# Patient Record
Sex: Male | Born: 1990 | Race: White | Hispanic: No | Marital: Single | Smoking: Never smoker
Health system: Southern US, Community
[De-identification: ages and names within clinical notes are randomized; demographics above are authoritative.]

## PROBLEM LIST (undated history)

## (undated) DIAGNOSIS — J189 Pneumonia, unspecified organism: Secondary | ICD-10-CM

## (undated) HISTORY — PX: WISDOM TOOTH EXTRACTION: SHX21

## (undated) HISTORY — PX: OTHER SURGICAL HISTORY: SHX169

---
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
## 2009-03-01 IMAGING — CR ORBITS - COMPLETE 4+ VIEW
1 series · 3 of 3 positions shown · non-contrast
Comparison: None.

REASON FOR EXAM: swollen s/p trauma
COMMENTS:

PROCEDURE:     MDR - MDR ORBITS COMPLETE  - July 10, 2008  [DATE]
RESULT:     Orbital screen dated 07/10/2008

[Series 1: view not recorded · 0.17mm/px · 3 of 3 slices shown]
[im 1/3]
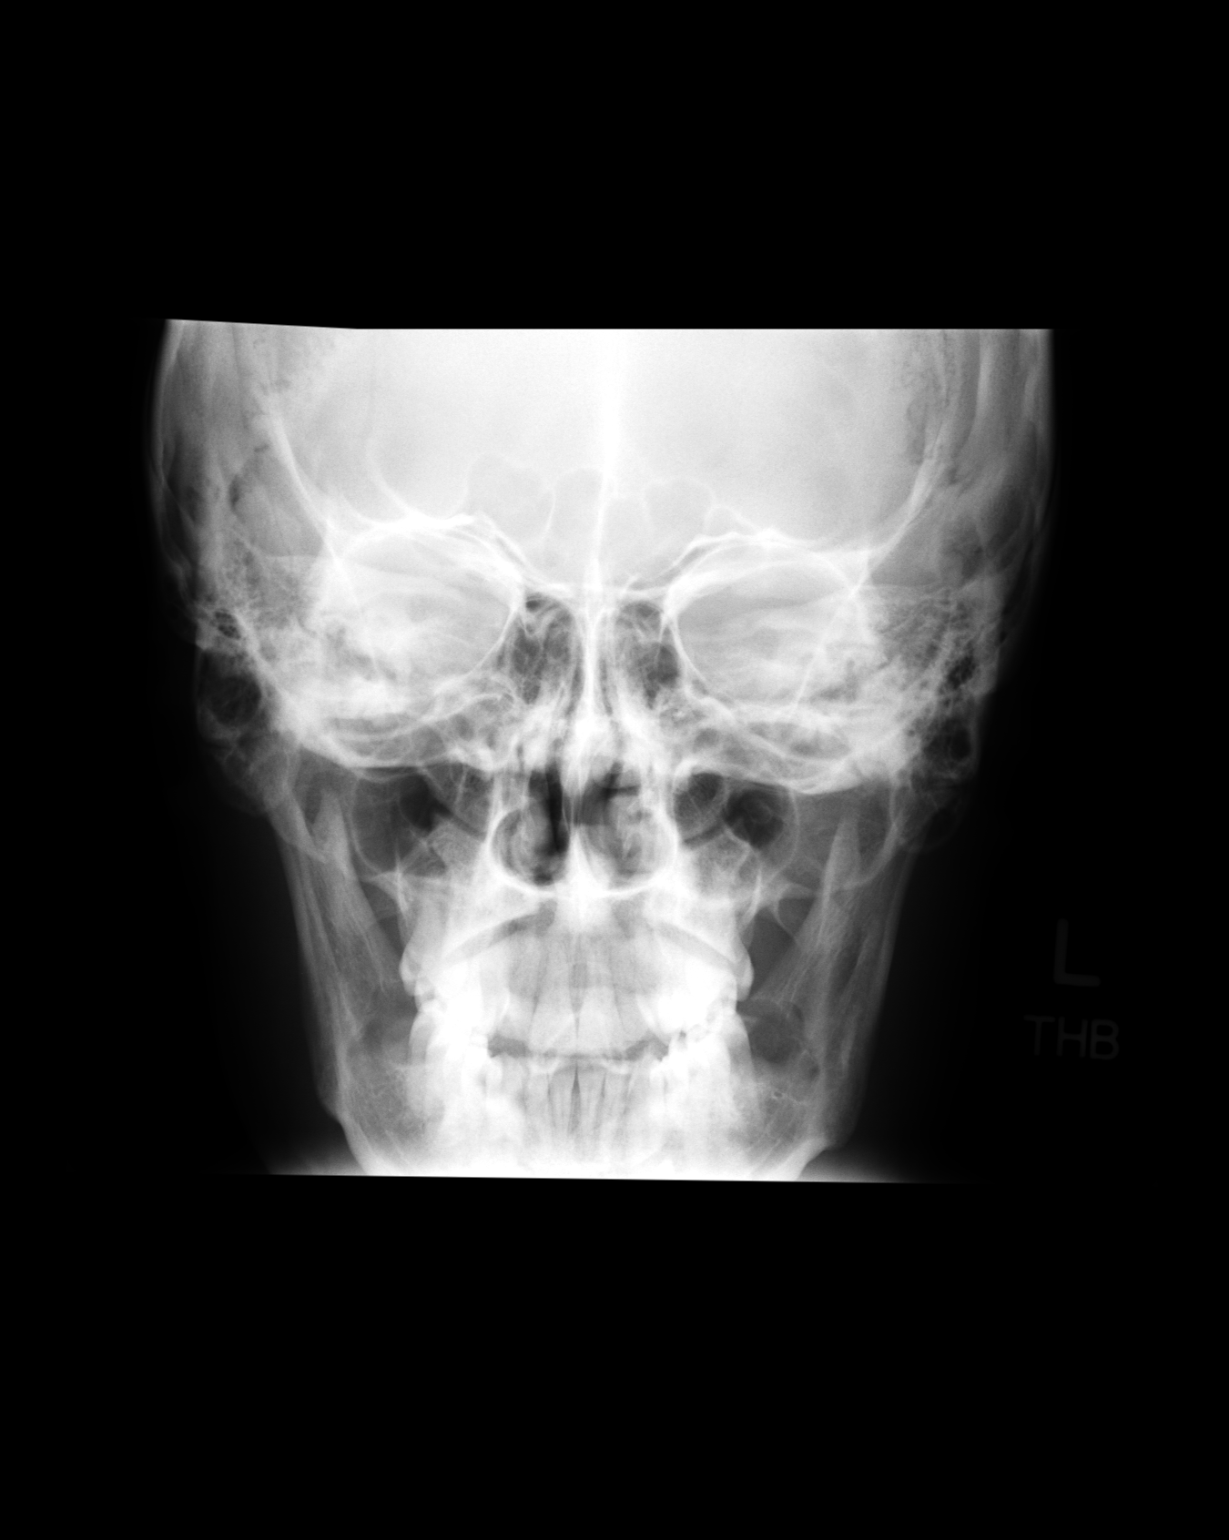
[im 2/3]
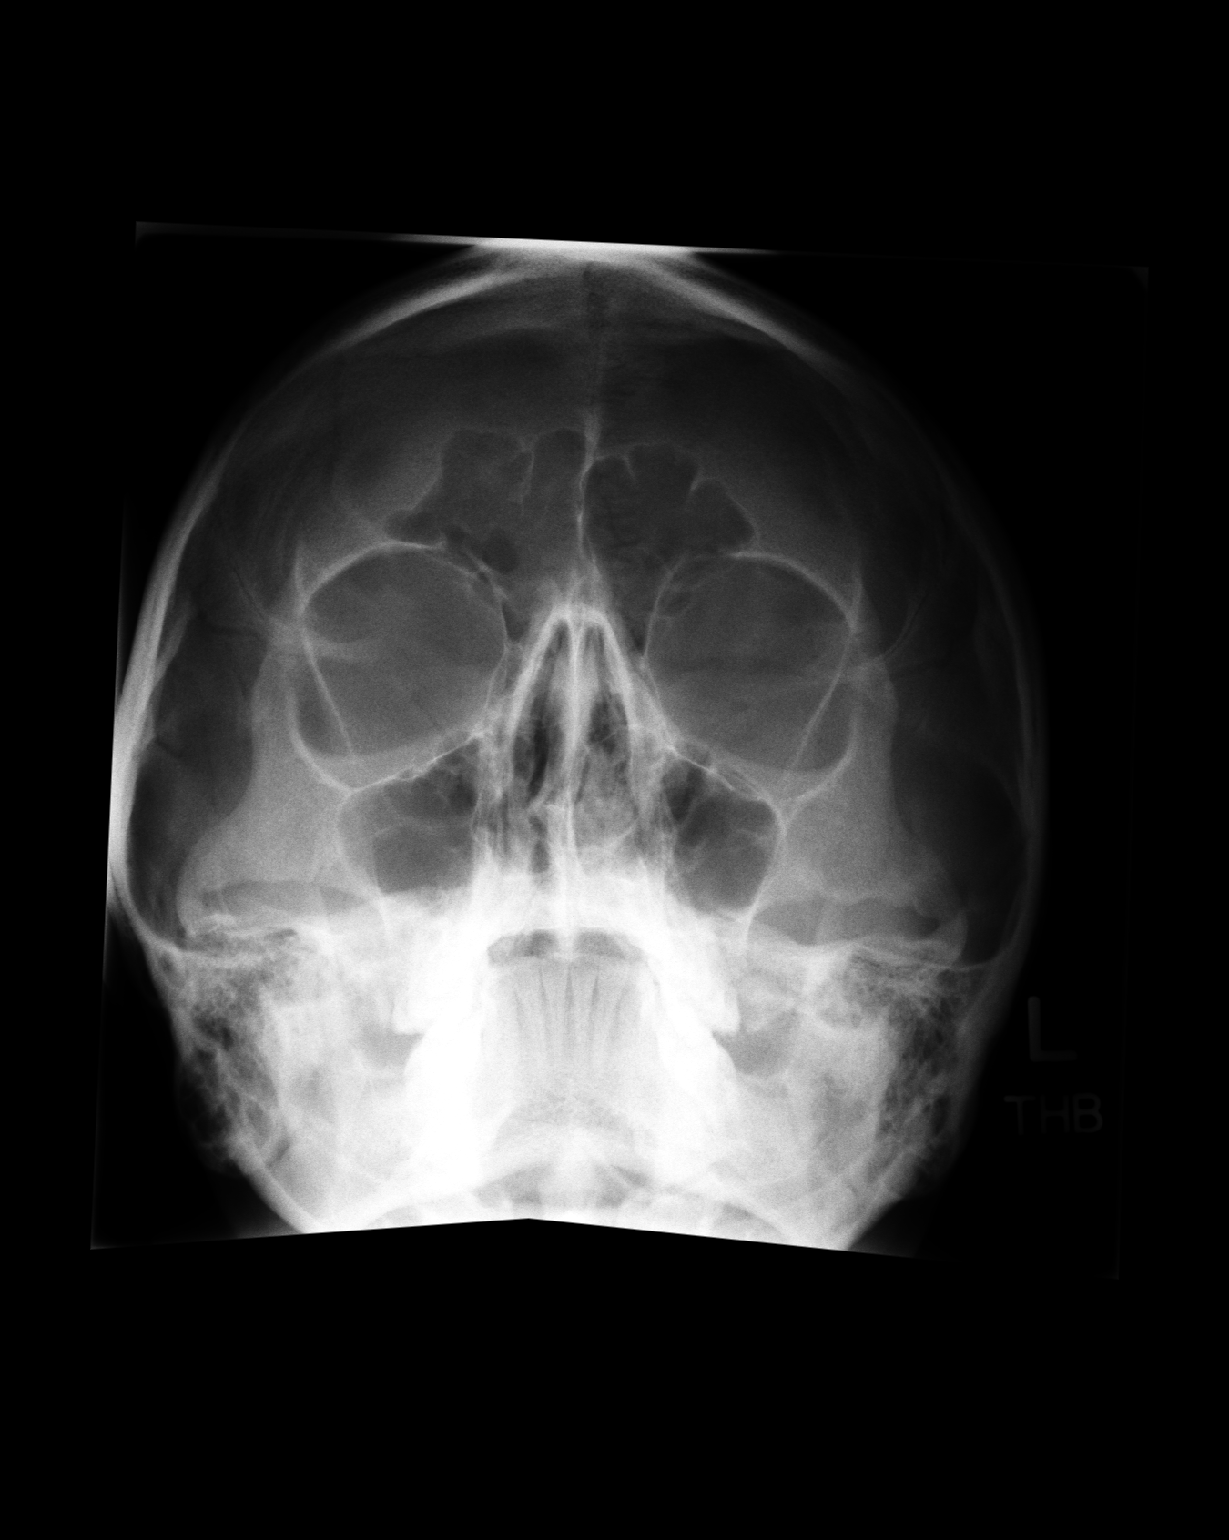
[im 3/3]
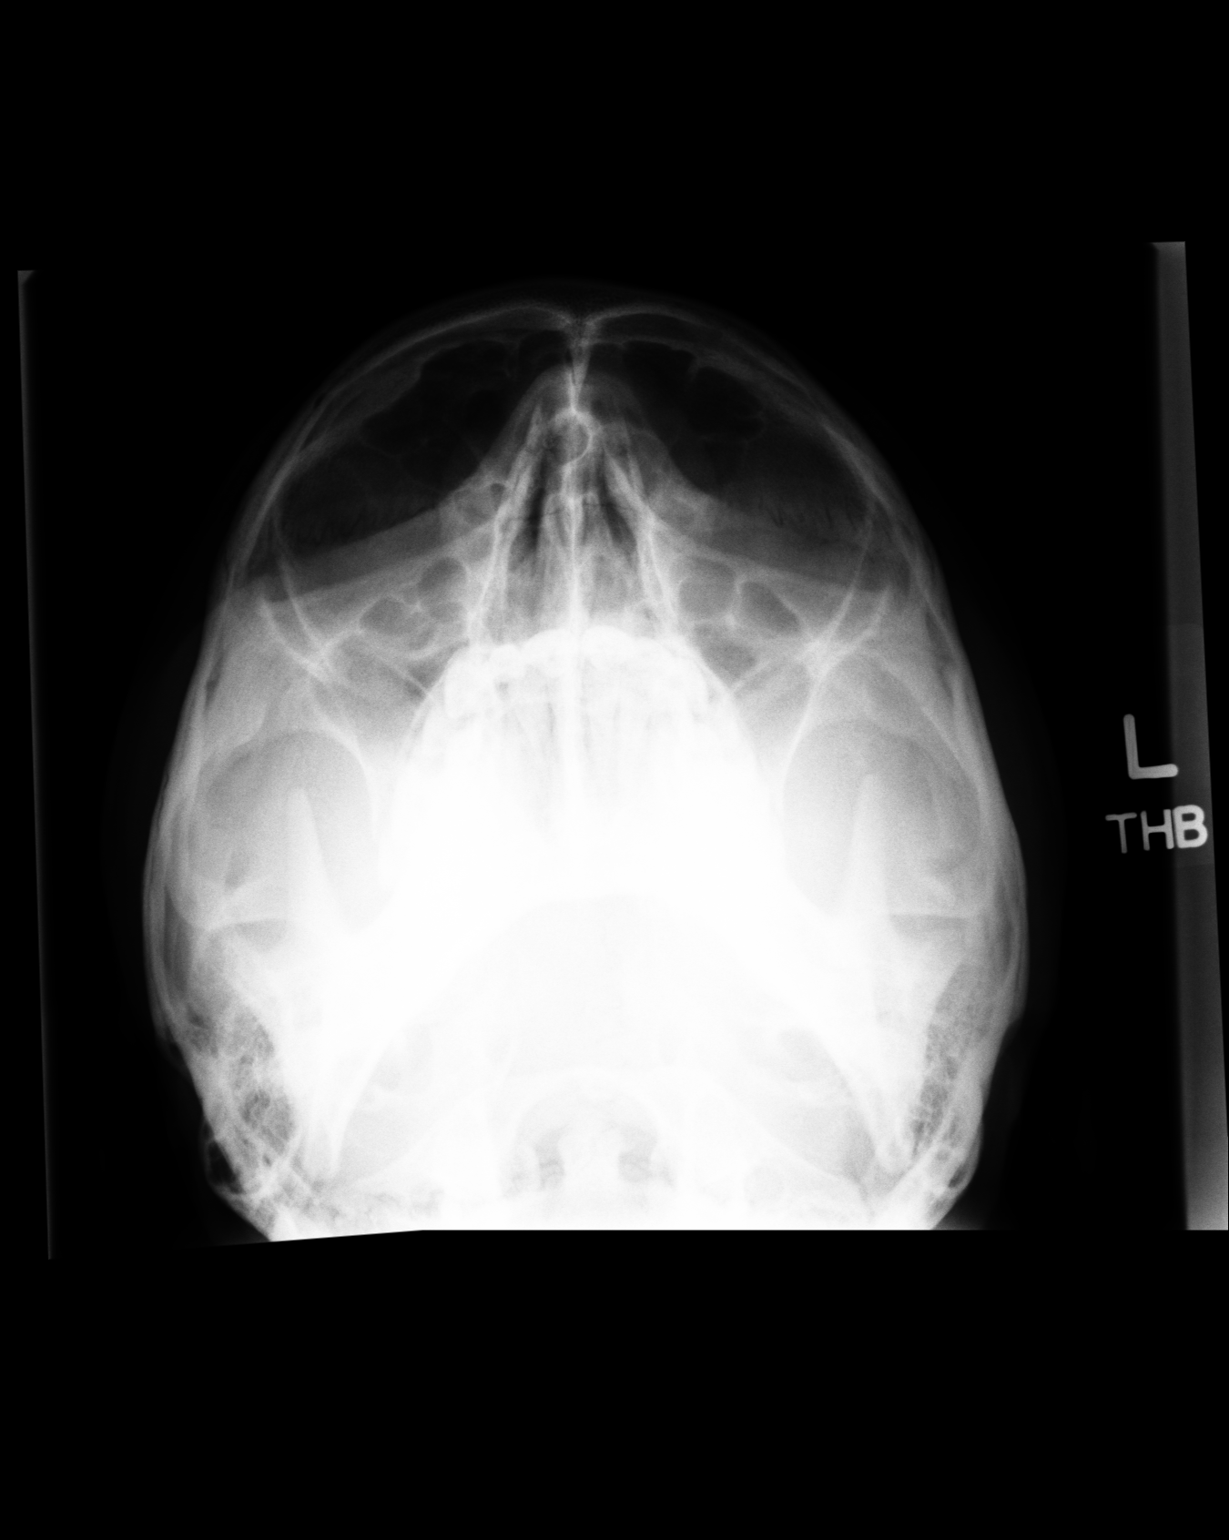

[3 of 3 positions shown; findings below may reference images not displayed]

FINDINGS: No metallic objects identified. No air-fluid levels within the
paranasal sinuses.
IMPRESSION: See above

## 2015-09-19 ENCOUNTER — Ambulatory Visit
Admission: EM | Admit: 2015-09-19 | Discharge: 2015-09-19 | Disposition: A | Discharge status: Home / Self Care | Payer: Self-pay | Attending: Family Medicine | Admitting: Family Medicine

## 2015-09-19 DIAGNOSIS — N433 Hydrocele, unspecified: Secondary | ICD-10-CM

## 2015-09-19 DIAGNOSIS — Z024 Encounter for examination for driving license: Secondary | ICD-10-CM

## 2015-09-19 DIAGNOSIS — Z021 Encounter for pre-employment examination: Secondary | ICD-10-CM

## 2015-09-19 LAB — DEPT OF TRANSP DIPSTICK, URINE (ARMC ONLY)
Glucose, UA: NEGATIVE mg/dL
Hgb urine dipstick: NEGATIVE
Protein, ur: NEGATIVE mg/dL
SPECIFIC GRAVITY, URINE: 1.025 (ref 1.005–1.030)

## 2015-09-19 NOTE — ED Provider Notes (Signed)
CSN: [id number]     Arrival date & time 09/19/15  0945 History   First MD Initiated Contact with Patient 09/19/15 1126     No chief complaint on file.  (Consider location/radiation/quality/duration/timing/severity/associated sxs/prior Treatment) The history is provided by the patient. No language interpreter was used.   Patient's here for DOT examination. He states he is healthy no medical problems but then added another problem at the end of the examination No past medical history on file. No past surgical history on file. No family history on file. Social History  Substance Use Topics  . Smoking status: Not on file  . Smokeless tobacco: Not on file  . Alcohol Use: Not on file    Review of Systems  Genitourinary: Positive for scrotal swelling.    Allergies  Review of patient's allergies indicates not on file.  Home Medications   Prior to Admission medications   Not on File   Meds Ordered and Administered this Visit  Medications - No data to display  There were no vitals taken for this visit. No data found.   Physical Exam  Constitutional: He is oriented to person, place, and time. He appears well-developed.  HENT:  Head: Normocephalic and atraumatic.  Eyes: Pupils are equal, round, and reactive to light.  Neck: Normal range of motion.  Cardiovascular: Normal rate and normal heart sounds.   Pulmonary/Chest: Effort normal.  Abdominal: Soft. Hernia confirmed negative in the right inguinal area and confirmed negative in the left inguinal area.  Genitourinary:    Right testis shows no swelling. Left testis shows swelling.  Suspected hydrocele  Musculoskeletal: Normal range of motion.  Neurological: He is alert and oriented to person, place, and time.  Skin: Skin is dry. No rash noted. No erythema.  Psychiatric: He has a normal mood and affect. His behavior is normal.  Vitals reviewed.   ED Course  Procedures (including critical care time)  Labs Review Labs  Reviewed  DEPT OF TRANSP DIPSTICK, URINE([facility] ONLY)    Imaging Review No results found.   Visual Acuity Review  Right Eye Distance:   Left Eye Distance:   Bilateral Distance:    Right Eye Near:   Left Eye Near:    Bilateral Near:      Results for orders placed or performed during the hospital encounter of 09/19/15  Dept of Transp dipstick, urine  Result Value Ref Range   Protein, ur NEGATIVE NEGATIVE mg/dL   Glucose, UA NEGATIVE NEGATIVE mg/dL   Specific Gravity, Urine 1.025 1.005 - 1.030   Hgb urine dipstick NEGATIVE NEGATIVE     MDM   1. Encounter for commercial driver medical examination (CDME)   2. Hydrocele, left    Patient has what appears be a large hydrocele. It is on the left side and has been there since the age 24 according to him. States that no doxepin never evaluated that he's had physicals but the area has been ignored. I strongly suggested that he contact a urologist to be seen and evaluated sure informed him I cannot say for sure that's why he needs a urologist to find out what the best plan and treatment. Will give him a DOT form for 2 years explained to him that all I can do is evaluate him for DOT. Any outside problems or issues has been addressed by him with his PCP or specialist of his choice.    Hassan Rowan, MD 09/19/15 [phone]

## 2015-09-19 NOTE — Discharge Instructions (Signed)
Strongly recommend follow-up with urology for suspected hydrocele. Unable to say for sure is a hydrocele but will provide information about hydrocele and evaluation of scrotal masses. 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 Hydrocele, Adult A hydrocele is a collection of fluid in the loose pouch of skin that holds the testicles (scrotum). Usually, it affects only one testicle. CAUSES This condition may be caused by:  An injury to the scrotum.  An infection.  A tumor or cancer of the testicle.  Twisting of a testicle.  Decreased blood flow to the scrotum. SYMPTOMS A hydrocele feels like a water-filled balloon. It may also feel heavy. A hydrocele can cause:  Swelling of the scrotum. The swelling may decrease when you lie down.  Swelling of the groin.  Mild discomfort in the scrotum.  Pain. This can develop if the hydrocele was caused by infection or twisting. DIAGNOSIS This condition may be diagnosed with a medical history, physical exam, and imaging tests. You may also have blood and urine tests to check for infection. TREATMENT Treatment may include:  Watching and waiting, particularly if the hydrocele causes no symptoms.  Treatment of the underlying condition. This may include using antibiotic medicine.  Surgery to drain the fluid. Some surgical options include:  Needle aspiration. For this procedure, a needle is used to drain fluid.  Hydrocelectomy. For this procedure, an incision is made in the scrotum to remove the fluid sac. HOME CARE INSTRUCTIONS  Keep all follow-up visits as told by your health care provider. This is important.  Watch the hydrocele for any changes.  Take over-the-counter and prescription medicines only as told by your health care provider.  If you were prescribed an antibiotic medicine, use it as told by your health care provider. Do not stop using the antibiotic even if your condition  improves. SEEK MEDICAL CARE IF:  The swelling in your scrotum or groin gets worse.  The hydrocele becomes red, firm, tender to the touch, or painful.  You notice any changes in the hydrocele.  You have a fever.   This information is not intended to replace advice given to you by your health care provider. Make sure you discuss any questions you have with your health care provider.   Document Released: 05/20/2010 Document Revised: 04/16/2015 Document Reviewed: 11/26/2014 Elsevier Interactive Patient Education 2016 [employer]  Scrotal Masses Scrotal swelling is common in men of all ages. Common types of testicular masses include:   Hydrocele. The most common benign testicular mass in an adult. Hydroceles are generally soft and painless collections of fluid in the scrotal sac. These can rapidly change size as the fluid enters or leaves. Hydroceles can be associated with an underlying cancer of the testicle.  Spermatoceles. Generally soft and painless cyst-like masses in the scrotum that contain fluid, usually above the testicle. They can rapidly change size as the fluid enters or leaves. They are more prominent while standing or exercising. Sometimes, spermatoceles may cause a sensation of heaviness or a dull ache.  Orchitis. Inflammation of the testicle. It is painful and may be associated with a fever or symptoms of a urinary tract infection, including frequent and painful urination. It is common in males who have the mumps.  Varicocele. An enlargement of the veins that drain the testicles. Varicoceles usually occur on the left side of the scrotum. This condition can increase the risk of infertility. Varicocele is sometimes more prominent while standing or exercising.  Sometimes, varicoceles may cause a sensation of heaviness or a dull ache.  Inguinal hernia. A bulge caused by a portion of intestine protruding into the scrotum through a weak area in the abdominal muscles. Hernias may or  may not be painful. They are soft and usually enlarge with coughing or straining.  Torsion of the testis. This can cause a testicular mass that develops quickly and is associated with tenderness or fever, or both. It is caused by a twisting of the testicle within the sac. It also reduces the blood supply and can destroy the testis if not treated quickly with surgery.  Epididymitis. Inflammation of the epididymis (a structure attached above and behind the testicle), usually caused by a urinary tract infection or a sexually transmitted infection. This generally shows up as testicular discomfort and swelling and may include pain during urination. It is frequently associated with a testicle infection.  Testicular appendages. Remnants of tissue on the testis present since birth. A testicular appendage can twist on its blood supply and cause pain. In most cases, this is seen as a blue dot on the scrotum.  Hematocele. A collection of blood between the layers of the sac inside the scrotum. It usually is caused by trauma to the scrotum.  Sebaceous cysts. These can be a swelling in the skin of the scrotum and are usually painless.  Cancer (carcinoma) of the skin of the scrotum. It can cause scrotal swelling, but this is rare.   This information is not intended to replace advice given to you by your health care provider. Make sure you discuss any questions you have with your health care provider.   Document Released: 06/06/2003 Document Revised: 08/02/2013 Document Reviewed: 05/22/2013 Elsevier Interactive Patient Education [employer]

## 2015-09-24 ENCOUNTER — Other Ambulatory Visit: Payer: Self-pay | Admitting: Urology

## 2015-09-24 ENCOUNTER — Ambulatory Visit
Admission: RE | Admit: 2015-09-24 | Discharge: 2015-09-24 | Disposition: A | Discharge status: Home / Self Care | Payer: BLUE CROSS/BLUE SHIELD | Source: Ambulatory Visit | Attending: Urology | Admitting: Urology

## 2015-09-24 ENCOUNTER — Other Ambulatory Visit: Payer: Self-pay

## 2015-09-24 ENCOUNTER — Encounter: Payer: Self-pay | Admitting: *Deleted

## 2015-09-24 DIAGNOSIS — N433 Hydrocele, unspecified: Secondary | ICD-10-CM

## 2015-09-24 NOTE — Patient Instructions (Signed)
  Your procedure is scheduled on: 10-01-15 Report to [facility] 2ND FLOOR To find out your arrival time please call [phone] between 1PM - 3PM on 09-30-15  Remember: Instructions that are not followed completely may result in serious medical risk, up to and including death, or upon the discretion of your surgeon and anesthesiologist your surgery may need to be rescheduled.    _X___ 1. Do not eat food or drink liquids after midnight. No gum chewing or hard candies.     _X___ 2. No Alcohol for 24 hours before or after surgery.   ____ 3. Bring all medications with you on the day of surgery if instructed.    ____ 4. Notify your doctor if there is any change in your medical condition     (cold, fever, infections).     Do not wear jewelry, make-up, hairpins, clips or nail polish.  Do not wear lotions, powders, or perfumes. You may wear deodorant.  Do not shave 48 hours prior to surgery. Men may shave face and neck.  Do not bring valuables to the hospital.    [facility] is not responsible for any belongings or valuables.               Contacts, dentures or bridgework may not be worn into surgery.  Leave your suitcase in the car. After surgery it may be brought to your room.  For patients admitted to the hospital, discharge time is determined by your treatment team.   Patients discharged the day of surgery will not be allowed to drive home.   Please read over the following fact sheets that you were given:     ____ Take these medicines the morning of surgery with A SIP OF WATER:    1. NONE  2.   3.   4.  5.  6.  ____ Fleet Enema (as directed)   ____ Use CHG Soap as directed  ____ Use inhalers on the day of surgery  ____ Stop metformin 2 days prior to surgery    ____ Take 1/2 of usual insulin dose the night before surgery and none on the morning of surgery.   ____ Stop Coumadin/Plavix/aspirin-N/A  ____ Stop Anti-inflammatories-NO NSAIDS- TYLENOL  OK   ____ Stop supplements until after surgery.    ____ Bring C-Pap to the hospital.

## 2015-09-30 ENCOUNTER — Inpatient Hospital Stay: Admission: RE | Admit: 2015-09-30 | Payer: BLUE CROSS/BLUE SHIELD | Source: Ambulatory Visit

## 2015-10-01 ENCOUNTER — Ambulatory Visit: Payer: BLUE CROSS/BLUE SHIELD | Admitting: Anesthesiology

## 2015-10-01 ENCOUNTER — Encounter: Payer: Self-pay | Admitting: *Deleted

## 2015-10-01 ENCOUNTER — Encounter
Admission: RE | Disposition: A | Discharge status: Home / Self Care | Payer: Self-pay | Source: Ambulatory Visit | Attending: Urology

## 2015-10-01 ENCOUNTER — Ambulatory Visit
Admission: RE | Admit: 2015-10-01 | Discharge: 2015-10-01 | Disposition: A | Discharge status: Home / Self Care | Payer: BLUE CROSS/BLUE SHIELD | Source: Ambulatory Visit | Attending: Urology | Admitting: Urology

## 2015-10-01 DIAGNOSIS — N432 Other hydrocele: Secondary | ICD-10-CM | POA: Insufficient documentation

## 2015-10-01 DIAGNOSIS — S3022XA Contusion of scrotum and testes, initial encounter: Secondary | ICD-10-CM | POA: Diagnosis present

## 2015-10-01 HISTORY — DX: Pneumonia, unspecified organism: J18.9

## 2015-10-01 HISTORY — PX: HYDROCELE EXCISION: SHX482

## 2015-10-01 SURGERY — HYDROCELECTOMY
Anesthesia: General | Site: Scrotum | Laterality: Left | Wound class: Clean Contaminated

## 2015-10-01 MED ORDER — LIDOCAINE HCL (PF) 1 % IJ SOLN
INTRAMUSCULAR | Status: AC
  Filled 2015-10-01: qty 30

## 2015-10-01 MED ORDER — OXYCODONE-ACETAMINOPHEN 10-325 MG PO TABS: 1.0000 | ORAL_TABLET | ORAL | Status: DC | PRN

## 2015-10-01 MED ORDER — CEFAZOLIN SODIUM 1-5 GM-% IV SOLN
INTRAVENOUS | Status: AC
  Administered 2015-10-01: 1 g via INTRAVENOUS
  Filled 2015-10-01: qty 50

## 2015-10-01 MED ORDER — FAMOTIDINE 20 MG PO TABS
20.0000 mg | ORAL_TABLET | Freq: Once | ORAL | Status: AC
  Administered 2015-10-01: 20 mg via ORAL

## 2015-10-01 MED ORDER — FENTANYL CITRATE (PF) 100 MCG/2ML IJ SOLN
INTRAMUSCULAR | Status: DC | PRN
  Administered 2015-10-01 (×2): 50 ug via INTRAVENOUS
  Administered 2015-10-01 (×4): 25 ug via INTRAVENOUS

## 2015-10-01 MED ORDER — BUPIVACAINE-EPINEPHRINE (PF) 0.5% -1:200000 IJ SOLN
INTRAMUSCULAR | Status: AC
  Filled 2015-10-01: qty 30

## 2015-10-01 MED ORDER — LIDOCAINE HCL (CARDIAC) 20 MG/ML IV SOLN
INTRAVENOUS | Status: DC | PRN
  Administered 2015-10-01: 60 mg via INTRAVENOUS

## 2015-10-01 MED ORDER — CEPHALEXIN 500 MG PO CAPS: 500.0000 mg | ORAL_CAPSULE | Freq: Four times a day (QID) | ORAL | Status: DC

## 2015-10-01 MED ORDER — LACTATED RINGERS IV SOLN
INTRAVENOUS | Status: DC
  Administered 2015-10-01 (×2): via INTRAVENOUS

## 2015-10-01 MED ORDER — DEXAMETHASONE SODIUM PHOSPHATE 10 MG/ML IJ SOLN
INTRAMUSCULAR | Status: DC | PRN
Start: 2015-10-01 — End: 2015-10-01
  Administered 2015-10-01: 10 mg via INTRAVENOUS

## 2015-10-01 MED ORDER — FAMOTIDINE 20 MG PO TABS
ORAL_TABLET | ORAL | Status: AC
  Administered 2015-10-01: 20 mg via ORAL
  Filled 2015-10-01: qty 1

## 2015-10-01 MED ORDER — KETOROLAC TROMETHAMINE 30 MG/ML IJ SOLN
INTRAMUSCULAR | Status: DC | PRN
  Administered 2015-10-01: 30 mg via INTRAVENOUS

## 2015-10-01 MED ORDER — BUPIVACAINE-EPINEPHRINE 0.5% -1:200000 IJ SOLN
INTRAMUSCULAR | Status: DC | PRN
  Administered 2015-10-01: 10 mL

## 2015-10-01 MED ORDER — DOCUSATE SODIUM 100 MG PO CAPS: 200.0000 mg | ORAL_CAPSULE | Freq: Two times a day (BID) | ORAL | Status: DC

## 2015-10-01 MED ORDER — LIDOCAINE HCL 1 % IJ SOLN
INTRAMUSCULAR | Status: DC | PRN
  Administered 2015-10-01: 10 mL

## 2015-10-01 MED ORDER — PROMETHAZINE HCL 25 MG/ML IJ SOLN: 6.2500 mg | INTRAMUSCULAR | Status: DC | PRN

## 2015-10-01 MED ORDER — FENTANYL CITRATE (PF) 100 MCG/2ML IJ SOLN: 25.0000 ug | INTRAMUSCULAR | Status: DC | PRN

## 2015-10-01 MED ORDER — NUCYNTA 50 MG PO TABS: 50.0000 mg | ORAL_TABLET | Freq: Four times a day (QID) | ORAL | Status: DC | PRN

## 2015-10-01 MED ORDER — CEFAZOLIN SODIUM 1-5 GM-% IV SOLN
1.0000 g | Freq: Once | INTRAVENOUS | Status: AC
  Administered 2015-10-01: 1 g via INTRAVENOUS

## 2015-10-01 MED ORDER — ONDANSETRON HCL 4 MG/2ML IJ SOLN
INTRAMUSCULAR | Status: DC | PRN
  Administered 2015-10-01: 4 mg via INTRAVENOUS

## 2015-10-01 MED ORDER — PROPOFOL 10 MG/ML IV BOLUS
INTRAVENOUS | Status: DC | PRN
Start: 2015-10-01 — End: 2015-10-01
  Administered 2015-10-01: 200 mg via INTRAVENOUS
  Administered 2015-10-01: 40 mg via INTRAVENOUS

## 2015-10-01 MED ORDER — ONDANSETRON 8 MG PO TBDP: 8.0000 mg | ORAL_TABLET | Freq: Four times a day (QID) | ORAL | Status: DC | PRN

## 2015-10-01 MED ORDER — MIDAZOLAM HCL 5 MG/5ML IJ SOLN
INTRAMUSCULAR | Status: DC | PRN
  Administered 2015-10-01: 2 mg via INTRAVENOUS

## 2015-10-01 SURGICAL SUPPLY — 31 items
BLADE CLIPPER SURG (BLADE) ×2 IMPLANT
BLADE SURG 15 STRL LF DISP TIS (BLADE) ×1 IMPLANT
BLADE SURG 15 STRL SS (BLADE) ×1
DRAPE PED LAPAROTOMY (DRAPES) ×2 IMPLANT
DRESSING TELFA 4X3 1S ST N-ADH (GAUZE/BANDAGES/DRESSINGS) ×4 IMPLANT
ELECT CAUTERY NEEDLE TIP 1.0 (MISCELLANEOUS) ×2
ELECTRODE CAUTERY NEDL TIP 1.0 (MISCELLANEOUS) ×1 IMPLANT
GAUZE FLUFF 18X24 1PLY STRL (GAUZE/BANDAGES/DRESSINGS) ×2 IMPLANT
GLOVE BIO SURGEON STRL SZ7.5 (GLOVE) ×2 IMPLANT
GOWN STRL REUS W/ TWL LRG LVL3 (GOWN DISPOSABLE) ×2 IMPLANT
GOWN STRL REUS W/TWL LRG LVL3 (GOWN DISPOSABLE) ×2
GOWN STRL REUS W/TWL XL LVL3 (GOWN DISPOSABLE) ×2 IMPLANT
KIT RM TURNOVER STRD PROC AR (KITS) ×2 IMPLANT
LABEL OR SOLS (LABEL) ×2 IMPLANT
NEEDLE HYPO 25X1 1.5 SAFETY (NEEDLE) ×2 IMPLANT
NS IRRIG 500ML POUR BTL (IV SOLUTION) ×2 IMPLANT
PACK BASIN MINOR ARMC (MISCELLANEOUS) ×2 IMPLANT
PAD GROUND ADULT SPLIT (MISCELLANEOUS) ×2 IMPLANT
PREP PVP WINGED SPONGE (MISCELLANEOUS) ×2 IMPLANT
SOL PREP PVP 2OZ (MISCELLANEOUS) ×2
SOLUTION PREP PVP 2OZ (MISCELLANEOUS) ×1 IMPLANT
SUPPORETR ATHLETIC LG (MISCELLANEOUS) ×1 IMPLANT
SUPPORTER ATHLETIC LG (MISCELLANEOUS) ×2
SUT CHROMIC 3 0 SH 27 (SUTURE) ×10 IMPLANT
SUT PLAIN 3 0 SH 27IN (SUTURE) IMPLANT
SUT PLAIN 4 0 FS 2 27 (SUTURE) IMPLANT
SUT VIC AB 4-0 PS2 18 (SUTURE) IMPLANT
SUT VIC AB 4-0 SH 27 (SUTURE)
SUT VIC AB 4-0 SH 27XANBCTRL (SUTURE) IMPLANT
SYR 50ML LL SCALE MARK (SYRINGE) IMPLANT
SYRINGE 10CC LL (SYRINGE) ×2 IMPLANT

## 2015-10-01 NOTE — Discharge Instructions (Addendum)
Hidrocele en los adultos (Hydrocele, Adult) El hidrocele es la acumulacin de lquido en la bolsa de piel flcida que aloja los testculos (escroto). Generalmente, afecta solo a un testculo. CAUSAS Esta afeccin puede ser causada por lo siguiente:  Una lesin en el escroto.  Una infeccin.  Un tumor o cncer en el testculo.  Torsin de Dance movement psychotherapist.  Disminucin del flujo sanguneo Financial trader. SNTOMAS El hidrocele se siente como un globo lleno de [place]. Tambin puede sentirse pesado. El hidrocele puede causar lo siguiente:  Hinchazn del escroto. Esta puede disminuir al [employer]  Hinchazn de la ingle.  Molestia leve en el escroto.  Dolor. Este puede aparecer si el hidrocele fue causado por una infeccin o una torsin. DIAGNSTICO Esta afeccin se puede diagnosticar mediante la historia clnica, un examen fsico y estudios de diagnstico por imgenes. Tambin se pueden hacer anlisis de [place] y de [place] para determinar si hay infecciones. TRATAMIENTO El tratamiento puede incluir lo siguiente:  Consulting civil engineer, en especial si el hidrocele no causa sntomas.  Tratamiento de la afeccin preexistente. Esto puede incluir la administracin de antibiticos.  Ciruga para drenar el lquido. Algunas opciones quirrgicas incluyen lo siguiente:  Aspiracin con aguja. Para este procedimiento, se [place] una aguja para drenar el lquido.  Hidrocelectoma. Para este procedimiento, se realiza una incisin en el escroto para extirpar el saco de lquido. INSTRUCCIONES PARA EL CUIDADO EN EL HOGAR  Concurra a todas las visitas de control como se lo haya indicado el mdico. Esto es importante.  Vigile el hidrocele para detectar cualquier cambio.  Tome los medicamentos de venta libre y los recetados solamente como se lo haya indicado el mdico.  Si le recetaron un antibitico, tmelo como se lo haya indicado el mdico. No deje de usar el antibitico aunque la afeccin  mejore. SOLICITE ATENCIN MDICA SI:  La hinchazn del escroto o de la ingle empeora.  El hidrocele se enrojece, est duro o sensible al tacto, o Presenter, broadcasting.  Observa cambios en el hidrocele.  Tiene fiebre.   Esta informacin no tiene Theme park manager el consejo del mdico. Asegrese de hacerle al mdico cualquier pregunta que tenga.   Document Released: 09/20/2013 Document Revised: 04/16/2015 Elsevier Interactive Patient Education 2016 [employer]. Hydrocelectomy, Care After Refer to this sheet in the next few weeks. These instructions provide you with information about caring for yourself after your procedure. Your health care provider may also give you more specific instructions. Your treatment has been planned according to current medical practices, but problems sometimes occur. Call your health care provider if you have any problems or questions after your procedure. WHAT TO EXPECT AFTER THE PROCEDURE After your procedure, it is common for the pouch that holds your testicles (scrotum) to be painful, swollen, and bruised. HOME CARE INSTRUCTIONS Bathing  Ask your health care provider when you can shower, take baths, or go swimming.  If you were told to wear an athletic support strap, take it off when you shower or take a bath. Incision Care  Follow instructions from your health care provider about how to take care of your incision. Make sure you:  Wash your hands with soap and water before you change your bandage (dressing). If soap and water are not available, use hand sanitizer.  Change your dressing as told by your health care provider.  Leave stitches (sutures) in place.  Check your incision and scrotum every day for signs of infection. Check for:  More redness, swelling, or pain.  Blood or fluid.  Warmth.  Pus or a bad smell. Managing Pain, Stiffness, and Swelling  If directed, apply ice to the injured area:  Put ice in a plastic bag.  Place a towel  between your skin and the bag.  Leave the ice on for 20 minutes, 2-3 times per day. Driving  Do not drive for 24 hours if you received a sedative.  Do not drive or operate heavy machinery while taking prescription pain medicine.  Ask your health care provider when it is safe to drive. Activity  Do not do any activities that require great strength and energy (are vigorous) for as long as told by your health care provider.  Return to your normal activities as told by your health care provider. Ask your health care provider what activities are safe for you.  Do not lift anything that is heavier than 10 lb (4.5 kg) until your health care provider says that it is safe. General Instructions  Take over-the-counter and prescription medicines only as told by your health care provider.  Keep all follow-up visits as told by your health care provider. This is important.  If you were given an athletic support strap, wear it as told by your health care provider.  If you had a drain put in during the procedure, you will need to return to have it removed. SEEK MEDICAL CARE IF:  Your pain gets worse.  You have more redness, swelling, or pain around your scrotum.  You have blood or fluid coming from your scrotum.  Your incision feels warm to the touch.  You have pus or a bad smell coming from your scrotum.  You have a fever.   This information is not intended to replace advice given to you by your health care provider. Make sure you discuss any questions you have with your health care provider.   Document Released: 08/21/2015 Document Reviewed: 05/29/2015 Elsevier Interactive Patient Education 2016 Elsevier Inc. Hydrocele, Adult A hydrocele is a collection of fluid in the loose pouch of skin that holds the testicles (scrotum). Usually, it affects only one testicle. CAUSES This condition may be caused by:  An injury to the scrotum.  An infection.  A tumor or cancer of the  testicle.  Twisting of a testicle.  Decreased blood flow to the scrotum. SYMPTOMS A hydrocele feels like a water-filled balloon. It may also feel heavy. A hydrocele can cause:  Swelling of the scrotum. The swelling may decrease when you lie down.  Swelling of the groin.  Mild discomfort in the scrotum.  Pain. This can develop if the hydrocele was caused by infection or twisting. DIAGNOSIS This condition may be diagnosed with a medical history, physical exam, and imaging tests. You may also have blood and urine tests to check for infection. TREATMENT Treatment may include:  Watching and waiting, particularly if the hydrocele causes no symptoms.  Treatment of the underlying condition. This may include using antibiotic medicine.  Surgery to drain the fluid. Some surgical options include:  Needle aspiration. For this procedure, a needle is used to drain fluid.  Hydrocelectomy. For this procedure, an incision is made in the scrotum to remove the fluid sac. HOME CARE INSTRUCTIONS  Keep all follow-up visits as told by your health care provider. This is important.  Watch the hydrocele for any changes.  Take over-the-counter and prescription medicines only as told by your health care provider.  If you were prescribed an antibiotic medicine, use it as told by your  health care provider. Do not stop using the antibiotic even if your condition improves. SEEK MEDICAL CARE IF:  The swelling in your scrotum or groin gets worse.  The hydrocele becomes red, firm, tender to the touch, or painful.  You notice any changes in the hydrocele.  You have a fever.   This information is not intended to replace advice given to you by your health care provider. Make sure you discuss any questions you have with your health care provider.   Document Released: 05/20/2010 Document Revised: 04/16/2015 Document Reviewed: 11/26/2014 Elsevier Interactive Patient Education 2016 Elsevier  Inc. Hydrocelectomy Hydrocelectomy is a procedure to drain a hydrocele, which is a sac where fluid has built up around the testicles. The procedure may be done if a hydrocele is causing painful swelling in the pouch that holds the testicles (scrotum). LET [facility]ARE PROVIDER KNOW ABOUT:  Any allergies you have.  All medicines you are taking, including vitamins, herbs, eye drops, creams, and over-the-counter medicines.  Previous problems you or members of your family have had with the use of anesthetics.  Any blood disorders you have.  Previous surgeries you have had.  Any medical conditions you have. RISKS AND COMPLICATIONS Generally this is a safe procedure. However, problems may occur, including:  Bleeding.  Bleeding into your scrotum (scrotal hematoma).  Damage to your testicle or the tube that carries sperm out of your testicle (vas deferens).  Infection.  Allergic reactions to medicines. BEFORE THE PROCEDURE  Ask your health care provider about:  Changing or stopping your regular medicines. This is especially important if you are taking diabetes medicines or blood thinners.  Taking medicines such as aspirin and ibuprofen. These medicines can thin your blood. Do not take these medicines before your procedure if your health care provider instructs you not to.  Follow instructions from your health care provider about eating or drinking restrictions.  Plan to have someone take you home after the procedure.  Ask your health care provider how your surgical site will be marked or identified.  You may be given antibiotic medicine to help prevent infection. PROCEDURE  To reduce your risk of infection:  Your health care team will wash or sanitize their hands.  Your scrotum and the area around it will be shaved and washed with soap.  An IV tube will be inserted into one of your veins.  You will be given one or more of the following:  A medicine to make you relax  (sedative).  A medicine to make you fall asleep (general anesthetic).  A small cut (incision) will be made through the skin of your scrotum.  Your testicle and the hydrocele will be located, and the hydrocele sac will be opened with an incision.  The fluid will be drained from the sac.  The sac will be closed with absorbable stitches (sutures) to prevent fluid from building up again.  If you have a large hydrocele, a thin rubber drain may be placed to allow fluid to drain after the procedure.  The incision in your scrotum will be closed with absorbable sutures.  A bandage (dressing) will be placed on your scrotum. The procedure may vary among health care providers and hospitals. AFTER THE PROCEDURE  Your blood pressure, heart rate, breathing rate, and blood oxygen level will be monitored often until the medicines you were given have worn off.  You will be given pain medicine as needed.  Do not drive for 24 hours if you received a  sedative.  You may have to wear an athletic support strap to hold the dressing in place and support your scrotum.          Use ice pack for 24 hours. This information is not intended to replace advice given to you by your health care provider. Make sure you discuss any questions you have with your health care provider.   Document Released: 08/21/2015 Document Reviewed: 05/29/2015 Elsevier Interactive Patient Education 2016 Elsevier Inc. General Anesthesia, Adult, Care After Refer to this sheet in the next few weeks. These instructions provide you with information on caring for yourself after your procedure. Your health care provider may also give you more specific instructions. Your treatment has been planned according to current medical practices, but problems sometimes occur. Call your health care provider if you have any problems or questions after your procedure. WHAT TO EXPECT AFTER THE PROCEDURE After the procedure, it is typical to  experience:  Sleepiness.  Nausea and vomiting. HOME CARE INSTRUCTIONS  For the first 24 hours after general anesthesia:  Have a responsible person with you.  Do not drive a car. If you are alone, do not take public transportation.  Do not drink alcohol.  Do not take medicine that has not been prescribed by your health care provider.  Do not sign important papers or make important decisions.  You may resume a normal diet and activities as directed by your health care provider.  Change bandages (dressings) as directed.  If you have questions or problems that seem related to general anesthesia, call the hospital and ask for the anesthetist or anesthesiologist on call. SEEK MEDICAL CARE IF:  You have nausea and vomiting that continue the day after anesthesia.  You develop a rash. SEEK IMMEDIATE MEDICAL CARE IF:   You have difficulty breathing.  You have chest pain.  You have any allergic problems.   This information is not intended to replace advice given to you by your health care provider. Make sure you discuss any questions you have with your health care provider.   Document Released: 03/08/2001 Document Revised: 12/21/2014 Document Reviewed: 03/30/2012 Elsevier Interactive Patient Education [employer]

## 2015-10-01 NOTE — Anesthesia Procedure Notes (Signed)
Procedure Name: LMA Insertion Date/Time: 10/01/2015 5:28 PM Performed by: Lily KocherPERALTA, Yaritzel Stange Pre-anesthesia Checklist: Patient identified, Patient being monitored, Timeout performed, Emergency Drugs available and Suction available Patient Re-evaluated:Patient Re-evaluated prior to inductionOxygen Delivery Method: Circle system utilized Preoxygenation: Pre-oxygenation with 100% oxygen Intubation Type: IV induction Ventilation: Mask ventilation without difficulty LMA: LMA inserted LMA Size: 5.0 Tube type: Oral Number of attempts: 1 Placement Confirmation: positive ETCO2 and breath sounds checked- equal and bilateral Tube secured with: Tape Dental Injury: Teeth and Oropharynx as per pre-operative assessment

## 2015-10-01 NOTE — Op Note (Signed)
Preoperative diagnosis: Left scrotal hematoma and/or hydrocele Postoperative diagnosis: Same  Procedure: 1. Left hydrocelectomy and evacuation of scrotal hematoma                        2. Left spermatic cord block Surgeon: Suszanne ConnersMichael R. Evelene CroonWolff MD, FACS Anesthesia: Gen.  Indications:See the history and physical. After informed consent the above procedure(s) were requested     Technique and findings: After adequate general anesthesia been obtained the perineum was prepped and draped in the usual fashion. Patient had a large left scrotal mass. A midline raphae scrotal incision was made and carried down sharply through the dartos fascia. Dartos fascia was then divided with electrocautery. The scrotal mass was exposed and appeared to be contiguous with the testicle. The testicle could not be separated from the mass. At this point the mass was incised with Q artery and 300 cc of brown fluid was evacuated. The mass appeared to be a thick-walled old hematoma that was contiguous to the testicle. The testicle was somewhat atrophic but appeared to be viable. Redundant sac was excised and submitted to pathology. The remaining sac was cleaned and irrigated with sterile saline. The sac was then marsupialized posterior to the testicle with a running locking 3-0 chromic suture. All bleeders were then attended to with electrocautery. The testicle was then pexed into the scrotal compartment with 3 strategically placed sutures of 3-0 chromic. Spermatic cord block was then performed with 10 cc of 1% Xylocaine. The subcutaneous tissue and dartos fascia was then injected with a total of 10 cc of 0.5% Marcaine with epinephrine. The dartos fascia was then reapproximated with running and locking 3-0 chromic. The skin was reapproximated with interrupted 3-0 chromic. Sterile dressing and scrotal supporter were applied. The sponge and needle and instrument counts were noted be correct. The procedure was then terminated and patient  transferred to the recovery room in stable condition.

## 2015-10-01 NOTE — H&P (Signed)
  Date of Initial H&P: 09/23/15  History reviewed, patient examined, no change in status, stable for surgery. 

## 2015-10-01 NOTE — Anesthesia Preprocedure Evaluation (Signed)
Anesthesia Evaluation  Patient identified by MRN, date of birth, ID band Patient awake    Reviewed: Allergy & Precautions, H&P , NPO status , Patient's Chart, lab work & pertinent test results, reviewed documented beta blocker date and time   History of Anesthesia Complications Negative for: history of anesthetic complications  Airway Mallampati: I  TM Distance: >3 FB Neck ROM: full    Dental no notable dental hx. (+) Teeth Intact   Pulmonary neg pulmonary ROS,    Pulmonary exam normal breath sounds clear to auscultation       Cardiovascular Exercise Tolerance: Good negative cardio ROS Normal cardiovascular exam Rhythm:regular Rate:Normal     Neuro/Psych negative neurological ROS  negative psych ROS   GI/Hepatic negative GI ROS, Neg liver ROS,   Endo/Other  negative endocrine ROS  Renal/GU negative Renal ROS  negative genitourinary   Musculoskeletal   Abdominal   Peds  Hematology negative hematology ROS (+)   Anesthesia Other Findings Past Medical History:   Pneumonia                                                      Comment:YEARS AGO   Reproductive/Obstetrics negative OB ROS                             Anesthesia Physical Anesthesia Plan  ASA: I  Anesthesia Plan: General   Post-op Pain Management:    Induction:   Airway Management Planned:   Additional Equipment:   Intra-op Plan:   Post-operative Plan:   Informed Consent: I have reviewed the patients History and Physical, chart, labs and discussed the procedure including the risks, benefits and alternatives for the proposed anesthesia with the patient or authorized representative who has indicated his/her understanding and acceptance.   Dental Advisory Given  Plan Discussed with: Anesthesiologist, CRNA and Surgeon  Anesthesia Plan Comments:         Anesthesia Quick Evaluation

## 2015-10-01 NOTE — Transfer of Care (Signed)
Immediate Anesthesia Transfer of Care Note  Patient: Joshua SneddonJeffrey D Ra Jr.  Procedure(s) Performed: Procedure(s): HYDROCELECTOMY ADULT (Left)  Patient Location: PACU  Anesthesia Type:General  Level of Consciousness: awake  Airway & Oxygen Therapy: Patient Spontanous Breathing and Patient connected to face mask oxygen  Post-op Assessment: Report given to RN  Post vital signs: Reviewed  Last Vitals:  Filed Vitals:   10/01/15 1840  BP: 134/95  Pulse: 76  Temp: 36.6 C  Resp: 12    Complications: No apparent anesthesia complications

## 2015-10-02 ENCOUNTER — Encounter: Payer: Self-pay | Admitting: Urology

## 2015-10-02 NOTE — Anesthesia Postprocedure Evaluation (Signed)
  Anesthesia Post-op Note  Patient: Joshua SneddonJeffrey D Estrada Jr.  Procedure(s) Performed: Procedure(s): HYDROCELECTOMY ADULT (Left)  Anesthesia type:General  Patient location: PACU  Post pain: Pain level controlled  Post assessment: Post-op Vital signs reviewed, Patient's Cardiovascular Status Stable, Respiratory Function Stable, Patent Airway and No signs of Nausea or vomiting  Post vital signs: Reviewed and stable  Last Vitals:  Filed Vitals:   10/01/15 1937  BP: 134/83  Pulse: 100  Temp:   Resp: 16    Level of consciousness: awake, alert  and patient cooperative  Complications: No apparent anesthesia complications

## 2015-10-03 LAB — SURGICAL PATHOLOGY

## 2015-12-15 DIAGNOSIS — N433 Hydrocele, unspecified: Secondary | ICD-10-CM

## 2015-12-15 HISTORY — DX: Hydrocele, unspecified: N43.3

## 2021-09-25 ENCOUNTER — Encounter: Payer: Self-pay | Admitting: Emergency Medicine

## 2021-09-25 ENCOUNTER — Ambulatory Visit
Admission: EM | Admit: 2021-09-25 | Discharge: 2021-09-25 | Disposition: A | Discharge status: Home / Self Care | Payer: BC Managed Care – PPO | Attending: Nurse Practitioner | Admitting: Nurse Practitioner

## 2021-09-25 ENCOUNTER — Other Ambulatory Visit: Payer: Self-pay

## 2021-09-25 DIAGNOSIS — R1032 Left lower quadrant pain: Secondary | ICD-10-CM | POA: Diagnosis not present

## 2021-09-25 LAB — BASIC METABOLIC PANEL
Anion gap: 6 (ref 5–15)
BUN: 14 mg/dL (ref 6–20)
CO2: 27 mmol/L (ref 22–32)
Calcium: 9.2 mg/dL (ref 8.9–10.3)
Chloride: 105 mmol/L (ref 98–111)
Creatinine, Ser: 1.07 mg/dL (ref 0.61–1.24)
GFR, Estimated: >60 mL/min (ref >60)
Glucose, Bld: 103 mg/dL — ABNORMAL HIGH (ref 70–99)
Potassium: 4.4 mmol/L (ref 3.5–5.1)
Sodium: 138 mmol/L (ref 135–145)

## 2021-09-25 LAB — URINALYSIS, COMPLETE (UACMP) WITH MICROSCOPIC
Bilirubin Urine: NEGATIVE
Glucose, UA: NEGATIVE mg/dL
Hgb urine dipstick: NEGATIVE
Ketones, ur: NEGATIVE mg/dL
Leukocytes,Ua: NEGATIVE
Nitrite: NEGATIVE
Protein, ur: NEGATIVE mg/dL
Specific Gravity, Urine: 1.02 (ref 1.005–1.030)
pH: 7 (ref 5.0–8.0)

## 2021-09-25 LAB — LIPASE, BLOOD: Lipase: 28 U/L (ref 11–51)

## 2021-09-25 MED ORDER — ONDANSETRON 8 MG PO TBDP: 8.0000 mg | ORAL_TABLET | Freq: Three times a day (TID) | ORAL | 0 refills | Status: AC | PRN

## 2021-09-25 MED ORDER — DICYCLOMINE HCL 20 MG PO TABS: 20.0000 mg | ORAL_TABLET | Freq: Four times a day (QID) | ORAL | 0 refills | Status: AC | PRN

## 2021-09-25 NOTE — Discharge Instructions (Signed)
Drink plenty of fluids  Take prescribed medications as needed  Monitor symptoms closely  You should be able to view your lab results in MyChart  Someone from clinical will call you as well with lab results and further recommendations    Go to the ED immediately if:  The pain gets worse   You are vomiting and cannot keep anything down. You have a fever. You have blood in your poop or urine  Or any other concerns

## 2021-09-25 NOTE — ED Triage Notes (Signed)
Pt presents today with c/o of mid lower abd pain that began on Sunday after he lifted heavy sheet rock. Denies v/d, with nausea at times with pain. Denies dysuria.

## 2021-09-25 NOTE — ED Provider Notes (Signed)
[facility]    CSN: [id number] Arrival date & time: 09/25/21  0825      History   Chief Complaint Chief Complaint  Patient presents with   Abdominal Pain    HPI Joshua Reynolds. is a 30 y.o. male.   Subjective:   Joshua Reynolds. is a 30 y.o. male who presents for evaluation of abdominal pain. The pain is described as aching and cramping with intermittent episodes of a more stabbing like sensation. He rates the pain as generally mild, around 4/10 in intensity. Pain is located in the LLQ without radiation. Onset was 4 days ago. Symptoms have been waxing and waning since onset. He denies any aggravating or alleviating factors. He endorses mild associating nausea. The patient denies anorexia, constipation, diarrhea, dysuria, fever, frequency, hematochezia, hematuria, melena, myalgias, or vomiting.  The following portions of the patient's history were reviewed and updated as appropriate: allergies, current medications, past family history, past medical history, past social history, past surgical history, and problem list.      Past Medical History:  Diagnosis Date   Hydrocele, left 2017   Pneumonia    YEARS AGO    Patient Active Problem List   Diagnosis Date Noted   Hydrocele, left 2017    Past Surgical History:  Procedure Laterality Date   CLOSED REDUCTION ARM      HYDROCELE EXCISION Left 10/01/2015   Procedure: HYDROCELECTOMY ADULT;  Surgeon: Orson Ape, MD;  Location: [facility] ORS;  Service: Urology;  Laterality: Left;   WISDOM TOOTH EXTRACTION         Home Medications    Prior to Admission medications   Medication Sig Start Date End Date Taking? Authorizing Provider  dicyclomine (BENTYL) 20 MG tablet Take 1 tablet (20 mg total) by mouth 4 (four) times daily as needed (abdominal pain). 09/25/21  Yes Lurline Idol, FNP  ondansetron (ZOFRAN ODT) 8 MG disintegrating tablet Take 1 tablet (8 mg total) by mouth every 8 (eight) hours as needed  for nausea or vomiting. 09/25/21  Yes Lurline Idol, FNP    Family History History reviewed. No pertinent family history.  Social History Social History   Tobacco Use   Smoking status: Never   Smokeless tobacco: Never  Substance Use Topics   Alcohol use: Yes    Comment: OCC   Drug use: No     Allergies   Patient has no known allergies.   Review of Systems Review of Systems  Constitutional:  Negative for appetite change and fever.  Gastrointestinal:  Positive for abdominal pain and nausea. Negative for constipation, diarrhea and vomiting.  Genitourinary:  Negative for difficulty urinating, dysuria, frequency and penile swelling.  Musculoskeletal:  Negative for myalgias.  All other systems reviewed and are negative.   Physical Exam Triage Vital Signs ED Triage Vitals  Enc Vitals Group     BP 09/25/21 0915 121/90     Pulse Rate 09/25/21 0915 97     Resp 09/25/21 0915 18     Temp 09/25/21 0915 98.3 F (36.8 C)     Temp Source 09/25/21 0915 Oral     SpO2 09/25/21 0915 100 %     Weight --      Height --      Head Circumference --      Peak Flow --      Pain Score 09/25/21 0912 3     Pain Loc --      Pain Edu? --  Excl. in GC? --    No data found.  Updated Vital Signs BP 121/90 (BP Location: Right Arm)   Pulse 97   Temp 98.3 F (36.8 C) (Oral)   Resp 18   SpO2 100%   Visual Acuity Right Eye Distance:   Left Eye Distance:   Bilateral Distance:    Right Eye Near:   Left Eye Near:    Bilateral Near:     Physical Exam Constitutional:      General: He is not in acute distress.    Appearance: He is well-developed. He is not ill-appearing, toxic-appearing or diaphoretic.  HENT:     Head: Normocephalic.  Cardiovascular:     Rate and Rhythm: Normal rate and regular rhythm.     Heart sounds: Normal heart sounds.  Pulmonary:     Effort: Pulmonary effort is normal.     Breath sounds: Normal breath sounds.  Abdominal:     General: Bowel sounds  are normal. There is no distension.     Palpations: Abdomen is soft.     Tenderness: There is abdominal tenderness in the left lower quadrant. There is no left CVA tenderness or rebound.     Hernia: No hernia is present.  Skin:    General: Skin is warm and dry.  Neurological:     General: No focal deficit present.     Mental Status: He is alert.  Psychiatric:        Mood and Affect: Mood normal.        Behavior: Behavior normal.     UC Treatments / Results  Labs (all labs ordered are listed, but only abnormal results are displayed) Labs Reviewed  BASIC METABOLIC PANEL  LIPASE, BLOOD  URINALYSIS, COMPLETE (UACMP) WITH MICROSCOPIC    EKG   Radiology No results found.  Procedures Procedures (including critical care time)  Medications Ordered in UC Medications - No data to display  Initial Impression / Assessment and Plan / UC Course  I have reviewed the triage vital signs and the nursing notes.  Pertinent labs & imaging results that were available during my care of the patient were reviewed by me and considered in my medical decision making (see chart for details).     30 yo male presenting with left lower abdominal pain without radiating for the past 4 days. Symptoms have been waxing and waning since onset. He endorses some associating nausea. No norexia, constipation, diarrhea, dysuria, fever, frequency, hematochezia, hematuria, melena, myalgias, or vomiting.  Patient is alert and oriented x3.  Afebrile.  Nontoxic-appearing.  Vital signs stable.  Physical exam reveals LLQ tenderness without rebound, guarding or masses. Urinalysis, lipase and CMP pending.  The diagnosis was discussed with the patient and evaluation and treatment plans outlined.  Bentyl and Zofran as needed for symptoms.  Discussed indications for immediate ED follow-up.  Further recommendations pending results of lab studies.  Today's evaluation has revealed no signs of a dangerous process. Discussed  diagnosis with patient and/or guardian. Patient and/or guardian aware of their diagnosis, possible red flag symptoms to watch out for and need for close follow up. Patient and/or guardian understands verbal and written discharge instructions. Patient and/or guardian comfortable with plan and disposition.  Patient and/or guardian has a clear mental status at this time, good insight into illness (after discussion and teaching) and has clear judgment to make decisions regarding their care  This care was provided during an unprecedented National Emergency due to the Novel Coronavirus (COVID-19) pandemic. COVID-19  infections and transmission risks place heavy strains on healthcare resources.  As this pandemic evolves, our facility, providers, and staff strive to respond fluidly, to remain operational, and to provide care relative to available resources and information. Outcomes are unpredictable and treatments are without well-defined guidelines. Further, the impact of COVID-19 on all aspects of urgent care, including the impact to patients seeking care for reasons other than COVID-19, is unavoidable during this national emergency. At this time of the global pandemic, management of patients has significantly changed, even for non-COVID positive patients given high local and regional COVID volumes at this time requiring high healthcare system and resource utilization. The standard of care for management of both COVID suspected and non-COVID suspected patients continues to change rapidly at the local, regional, national, and global levels. This patient was worked up and treated to the best available but ever changing evidence and resources available at this current time.   Documentation was completed with the aid of voice recognition software. Transcription may contain typographical errors. Final Clinical Impressions(s) / UC Diagnoses   Final diagnoses:  Abdominal pain, left lower quadrant     Discharge  Instructions      Drink plenty of fluids  Take prescribed medications as needed  Monitor symptoms closely  You should be able to view your lab results in MyChart  Someone from clinical will call you as well with lab results and further recommendations    Go to the ED immediately if:  The pain gets worse   You are vomiting and cannot keep anything down. You have a fever. You have blood in your poop or urine  Or any other concerns      ED Prescriptions     Medication Sig Dispense Auth. Provider   dicyclomine (BENTYL) 20 MG tablet Take 1 tablet (20 mg total) by mouth 4 (four) times daily as needed (abdominal pain). 20 tablet Lurline Idol, FNP   ondansetron (ZOFRAN ODT) 8 MG disintegrating tablet Take 1 tablet (8 mg total) by mouth every 8 (eight) hours as needed for nausea or vomiting. 20 tablet Lurline Idol, FNP      PDMP not reviewed this encounter.   Lurline Idol, [place] 09/25/21 1002

## 2023-07-02 ENCOUNTER — Ambulatory Visit
Admission: RE | Admit: 2023-07-02 | Discharge: 2023-07-02 | Disposition: A | Discharge status: Home / Self Care | Payer: Commercial Managed Care - PPO | Source: Ambulatory Visit | Attending: Physician Assistant | Admitting: Physician Assistant

## 2023-07-02 VITALS — BP 120/77 | HR 84 | Temp 98.2°F | Resp 18

## 2023-07-02 DIAGNOSIS — L255 Unspecified contact dermatitis due to plants, except food: Secondary | ICD-10-CM

## 2023-07-02 MED ORDER — TRIAMCINOLONE ACETONIDE 0.5 % EX OINT: TOPICAL_OINTMENT | CUTANEOUS | 0 refills | Status: AC

## 2023-07-02 MED ORDER — METHYLPREDNISOLONE 4 MG PO TBPK: ORAL_TABLET | ORAL | 0 refills | Status: DC

## 2023-07-02 NOTE — Discharge Instructions (Addendum)
-  Try the topical first for a few days. If no improvement fill and take the corticosteroid taper.

## 2023-07-02 NOTE — ED Triage Notes (Signed)
Pt present rash on left arm, pt noticed the rash three days ago. Pt states the area is itching.

## 2023-07-02 NOTE — ED Provider Notes (Signed)
[facility]    CSN: [id number] Arrival date & time: 07/02/23  1353      History   Chief Complaint Chief Complaint  Patient presents with   Rash    HPI Joshua Reynolds. is a 32 y.o. male presenting for pruritic rash of left forearm x 3 days. Patient believes it is poison oak. He has been using an old prescription of triamcinolone ointment x 3 days without relief. Denies pain of rash.   HPI  Past Medical History:  Diagnosis Date   Hydrocele, left 2017   Pneumonia    YEARS AGO    Patient Active Problem List   Diagnosis Date Noted   Hydrocele, left 2017    Past Surgical History:  Procedure Laterality Date   CLOSED REDUCTION ARM      HYDROCELE EXCISION Left 10/01/2015   Procedure: HYDROCELECTOMY ADULT;  Surgeon: Orson Ape, MD;  Location: [facility];  Service: Urology;  Laterality: Left;   WISDOM TOOTH EXTRACTION         Home Medications    Prior to Admission medications   Medication Sig Start Date End Date Taking? Authorizing Provider  triamcinolone ointment (KENALOG) 0.5 % 1 application to rash BID 07/02/23  Yes Eusebio Friendly B, PA-C  dicyclomine (BENTYL) 20 MG tablet Take 1 tablet (20 mg total) by mouth 4 (four) times daily as needed (abdominal pain). 09/25/21   Lurline Idol, FNP  methylPREDNISolone (MEDROL DOSEPAK) 4 MG TBPK tablet Take PO according to dosepack 07/02/23   Eusebio Friendly B, PA-C  ondansetron (ZOFRAN ODT) 8 MG disintegrating tablet Take 1 tablet (8 mg total) by mouth every 8 (eight) hours as needed for nausea or vomiting. 09/25/21   Lurline Idol, FNP    Family History History reviewed. No pertinent family history.  Social History Social History   Tobacco Use   Smoking status: Never   Smokeless tobacco: Never  Substance Use Topics   Alcohol use: Yes    Comment: OCC   Drug use: No     Allergies   Patient has no known allergies.   Review of Systems Review of Systems  Constitutional:  Negative for fatigue  and fever.  Musculoskeletal:  Negative for joint swelling.  Skin:  Positive for rash. Negative for wound.     Physical Exam Triage Vital Signs ED Triage Vitals  Encounter Vitals Group     BP      Systolic BP Percentile      Diastolic BP Percentile      Pulse      Resp      Temp      Temp src      SpO2      Weight      Height      Head Circumference      Peak Flow      Pain Score      Pain Loc      Pain Education      Exclude from Growth Chart    No data found.  Updated Vital Signs BP 120/77 (BP Location: Right Arm)   Pulse 84   Temp 98.2 F (36.8 C) (Oral)   Resp 18   SpO2 99%    Physical Exam Vitals and nursing note reviewed.  Constitutional:      General: He is not in acute distress.    Appearance: Normal appearance. He is well-developed. He is not ill-appearing.  HENT:     Head: Normocephalic and atraumatic.  Eyes:     General: No scleral icterus.    Conjunctiva/sclera: Conjunctivae normal.  Cardiovascular:     Rate and Rhythm: Normal rate.  Pulmonary:     Effort: Pulmonary effort is normal. No respiratory distress.  Musculoskeletal:     Cervical back: Neck supple.  Skin:    General: Skin is warm and dry.     Capillary Refill: Capillary refill takes less than 2 seconds.     Findings: Rash (multple clusters of vesicles on erythematous base of lef forearm.) present.  Neurological:     General: No focal deficit present.     Mental Status: He is alert. Mental status is at baseline.     Motor: No weakness.     Gait: Gait normal.  Psychiatric:        Mood and Affect: Mood normal.        Behavior: Behavior normal.      UC Treatments / Results  Labs (all labs ordered are listed, but only abnormal results are displayed) Labs Reviewed - No data to display  EKG   Radiology No results found.  Procedures Procedures (including critical care time)  Medications Ordered in UC Medications - No data to display  Initial Impression / Assessment and  Plan / UC Course  I have reviewed the triage vital signs and the nursing notes.  Pertinent labs & imaging results that were available during my care of the patient were reviewed by me and considered in my medical decision making (see chart for details).   32 y/o male presents for pruritic rash of left forearm which he suspects is due to poison oak x 3 days. No real improvement with topical triamcinolone (32 yr old prescription). Rash is consistent with contact dermatitis, likely poisonous plant dermatitis. Prescribed triamcinolone and encouraged him to try it for longer. Printed rx for medrol in case he is not responding to topical over the next few days.   Final Clinical Impressions(s) / UC Diagnoses   Final diagnoses:  Contact dermatitis due to plants, except food, unspecified contact dermatitis type     Discharge Instructions      -Try the topical first for a few days. If no improvement fill and take the corticosteroid taper.     ED Prescriptions     Medication Sig Dispense Auth. Provider   methylPREDNISolone (MEDROL DOSEPAK) 4 MG TBPK tablet  (Status: Discontinued) Take PO according to dosepack 21 tablet Eusebio Friendly B, PA-C   triamcinolone ointment (KENALOG) 0.5 % 1 application to rash BID 30 g Abdulmalik Darco B, PA-C   methylPREDNISolone (MEDROL DOSEPAK) 4 MG TBPK tablet Take PO according to dosepack 21 tablet Shirlee Latch, PA-C      PDMP not reviewed this encounter.   Shirlee Latch, PA-C 07/02/23 1439

## 2023-07-29 DIAGNOSIS — L578 Other skin changes due to chronic exposure to nonionizing radiation: Secondary | ICD-10-CM | POA: Diagnosis not present

## 2023-07-29 DIAGNOSIS — H61001 Unspecified perichondritis of right external ear: Secondary | ICD-10-CM | POA: Diagnosis not present

## 2023-07-29 DIAGNOSIS — D2361 Other benign neoplasm of skin of right upper limb, including shoulder: Secondary | ICD-10-CM | POA: Diagnosis not present

## 2023-07-29 DIAGNOSIS — R208 Other disturbances of skin sensation: Secondary | ICD-10-CM | POA: Diagnosis not present

## 2023-07-29 DIAGNOSIS — D485 Neoplasm of uncertain behavior of skin: Secondary | ICD-10-CM | POA: Diagnosis not present

## 2024-02-22 DIAGNOSIS — H01114 Allergic dermatitis of left upper eyelid: Secondary | ICD-10-CM | POA: Diagnosis not present

## 2024-02-22 DIAGNOSIS — H01111 Allergic dermatitis of right upper eyelid: Secondary | ICD-10-CM | POA: Diagnosis not present

## 2024-09-28 ENCOUNTER — Ambulatory Visit: Admission: EM | Admit: 2024-09-28 | Discharge: 2024-09-28 | Disposition: A | Discharge status: Home / Self Care

## 2024-09-28 ENCOUNTER — Encounter: Payer: Self-pay | Admitting: Emergency Medicine

## 2024-09-28 DIAGNOSIS — M5441 Lumbago with sciatica, right side: Secondary | ICD-10-CM

## 2024-09-28 DIAGNOSIS — Z758 Other problems related to medical facilities and other health care: Secondary | ICD-10-CM | POA: Diagnosis not present

## 2024-09-28 MED ORDER — METHYLPREDNISOLONE 4 MG PO TBPK: ORAL_TABLET | ORAL | 0 refills | Status: AC

## 2024-09-28 MED ORDER — CYCLOBENZAPRINE HCL 10 MG PO TABS: 10.0000 mg | ORAL_TABLET | Freq: Two times a day (BID) | ORAL | 0 refills | Status: AC | PRN

## 2024-09-28 NOTE — ED Provider Notes (Signed)
 [facility]    CSN: [id number] Arrival date & time: 09/28/24  1421      History   Chief Complaint Chief Complaint  Patient presents with   Back Pain    HPI Joshua Reynolds. is a 33 y.o. male.   33 year old male patient, Joshua Reynolds, Scal[place]., presents to urgent care for evaluation of back pain x 11 days.  Patient denies any injury but did go skating with this child on 10/ 3 prior to back pain starting. 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 Took Tylenol  within, has felt some improvement but still sore.  Patient denies any loss of bowel or bladder, no saddle numbness, no weakness, no loss of function, no foot drop.  The history is provided by the patient. No language interpreter was used.    Past Medical History:  Diagnosis Date   Hydrocele, left 2017   Pneumonia    YEARS AGO    Patient Active Problem List   Diagnosis Date Noted   Acute right-sided low back pain with right-sided sciatica 09/28/2024   Does not have primary care provider 09/28/2024   Hydrocele, left 2017    Past Surgical History:  Procedure Laterality Date   CLOSED REDUCTION ARM      HYDROCELE EXCISION Left 10/01/2015   Procedure: HYDROCELECTOMY ADULT;  Surgeon: Ozell JONELLE Burkes, MD;  Location: [facility];  Service: Urology;  Laterality: Left;   WISDOM TOOTH EXTRACTION         Home Medications    Prior to Admission medications   Medication Sig Start Date End Date Taking? Authorizing Provider  benzonatate (TESSALON) 100 MG capsule 1 capsule  three times a day for by mouth - 10/29/21  Yes [provider]  cyclobenzaprine (FLEXERIL) 10 MG tablet Take 1 tablet (10 mg total) by mouth 2 (two) times daily as needed for muscle spasms. 09/28/24  Yes Alejandra Hunt, NP  dicyclomine  (BENTYL ) 20 MG tablet Take 1 tablet (20 mg total) by mouth 4 (four) times daily as needed (abdominal pain). 09/25/21   Iola Lukes, FNP  methylPREDNISolone  (MEDROL  DOSEPAK) 4 MG  TBPK tablet Take PO according to dosepack 09/28/24   Rivky Clendenning, Rilla, NP  ondansetron  (ZOFRAN  ODT) 8 MG disintegrating tablet Take 1 tablet (8 mg total) by mouth every 8 (eight) hours as needed for nausea or vomiting. 09/25/21   Murrill, Samantha, FNP  triamcinolone  ointment (KENALOG ) 0.5 % 1 application to rash BID 07/02/23   Arvis Jolan NOVAK, PA-C  valACYclovir (VALTREX) 1000 MG tablet Take 2,000 mg by mouth 2 (two) times daily.    [provider]    Family History History reviewed. No pertinent family history.  Social History Social History   Tobacco Use   Smoking status: Never    Passive exposure: Never   Smokeless tobacco: Never  Vaping Use   Vaping status: Never Used  Substance Use Topics   Alcohol use: Yes    Comment: OCC   Drug use: No     Allergies   Patient has no known allergies.   Review of Systems Review of Systems  Constitutional:  Negative for fever.  Genitourinary:  Negative for dysuria.  Musculoskeletal:  Positive for back pain and myalgias.  Skin: Negative.   All other systems reviewed and are negative.    Physical Exam Triage Vital Signs ED Triage Vitals  Encounter Vitals Group     BP 09/28/24 1434 (!) 131/90     Girls Systolic BP Percentile --  Girls Diastolic BP Percentile --      Boys Systolic BP Percentile --      Boys Diastolic BP Percentile --      Pulse Rate 09/28/24 1434 86     Resp 09/28/24 1434 18     Temp 09/28/24 1434 98.8 F (37.1 C)     Temp Source 09/28/24 1434 Oral     SpO2 09/28/24 1434 97 %     Weight 09/28/24 1433 228 lb (103.4 kg)     Height --      Head Circumference --      Peak Flow --      Pain Score 09/28/24 1432 6     Pain Loc --      Pain Education --      Exclude from Growth Chart --    No data found.  Updated Vital Signs BP (!) 131/90 (BP Location: Right Arm)   Pulse 86   Temp 98.8 F (37.1 C) (Oral)   Resp 18   Wt 228 lb (103.4 kg)   SpO2 97%   BMI 33.67 kg/m   Visual  Acuity Right Eye Distance:   Left Eye Distance:   Bilateral Distance:    Right Eye Near:   Left Eye Near:    Bilateral Near:     Physical Exam Vitals and nursing note reviewed.  Constitutional:      General: He is not in acute distress.    Appearance: He is well-developed.  HENT:     Head: Normocephalic and atraumatic.  Eyes:     Conjunctiva/sclera: Conjunctivae normal.  Cardiovascular:     Rate and Rhythm: Normal rate and regular rhythm.     Heart sounds: No murmur heard. Pulmonary:     Effort: Pulmonary effort is normal. No respiratory distress.     Breath sounds: Normal breath sounds.  Abdominal:     Palpations: Abdomen is soft.     Tenderness: There is no abdominal tenderness.  Musculoskeletal:        General: No swelling.     Cervical back: Neck supple.     Lumbar back: Spasms and tenderness present. No swelling, edema, deformity, signs of trauma, lacerations or bony tenderness. Normal range of motion. Positive right straight leg raise test. Negative left straight leg raise test. No scoliosis.     Comments: +SLE at 45 degrees  Skin:    General: Skin is warm and dry.     Capillary Refill: Capillary refill takes less than 2 seconds.  Neurological:     General: No focal deficit present.     Mental Status: He is alert and oriented to person, place, and time.     GCS: GCS eye subscore is 4. GCS verbal subscore is 5. GCS motor subscore is 6.  Psychiatric:        Attention and Perception: Attention normal.        Mood and Affect: Mood normal.        Speech: Speech normal.        Behavior: Behavior normal. Behavior is cooperative.      UC Treatments / Results  Labs (all labs ordered are listed, but only abnormal results are displayed) Labs Reviewed - No data to display  EKG   Radiology No results found.  Procedures Procedures (including critical care time)  Medications Ordered in UC Medications - No data to display  Initial Impression / Assessment and  Plan / UC Course  I have reviewed the triage vital signs  and the nursing notes.  Pertinent labs & imaging results that were available during my care of the patient were reviewed by me and considered in my medical decision making (see chart for details).    Discussed exam findings and plan of care with patient, strict go to ER precautions given.   Patient verbalized understanding to this provider.  Ddx: Acute right-sided low back pain with right-sided sciatica, muscle strain, muscle spasm Final Clinical Impressions(s) / UC Diagnoses   Final diagnoses:  Does not have primary care provider  Acute right-sided low back pain with right-sided sciatica     Discharge Instructions      Call Ortho for appt- [facility]: [address] [place], [place] [address] Phone: [phone] Urgent care hours 8a-7:30p Mon-Sat Take Flexeril as prescribed, drowsiness precautions, take steroids as prescribed. May use heat or ice to back for comfort. May use lidocaine  patch or biofreeze for pain as label directed. Please get established with PCP, may need to referral to physical therapy for further back pain management. GO immediately to ER for loss of bowel and bladder,loss of function, saddle numbness, worsening pain ,etc.      ED Prescriptions     Medication Sig Dispense Auth. Provider   methylPREDNISolone  (MEDROL  DOSEPAK) 4 MG TBPK tablet Take PO according to dosepack 21 tablet Mccabe Gloria, NP   cyclobenzaprine (FLEXERIL) 10 MG tablet Take 1 tablet (10 mg total) by mouth 2 (two) times daily as needed for muscle spasms. 20 tablet Tresten Pantoja, Rilla, NP      PDMP not reviewed this encounter.   Aminta Rilla, NP 09/28/24 2124

## 2024-09-28 NOTE — Discharge Instructions (Addendum)
 Call Ortho for appt- [facility]: [address] [place], [place] [address] Phone: [phone] Urgent care hours 8a-7:30p Mon-Sat Take Flexeril as prescribed, drowsiness precautions, take steroids as prescribed. May use heat or ice to back for comfort. May use lidocaine  patch or biofreeze for pain as label directed. Please get established with PCP, may need to referral to physical therapy for further back pain management. GO immediately to ER for loss of bowel and bladder,loss of function, saddle numbness, worsening pain ,etc.

## 2024-09-28 NOTE — ED Triage Notes (Signed)
 Pt presents c/o L back pain x 11 days. Pt reports last Sunday he woke up a little sore. By Tuesday he couldn't stand up straight about an hour after waking up. Since then the pain has improved but not well enough to go to work. Pt reports on 09/15/24 he did go skating with his child and believes that contributed to the pain. Pt denies falls and/or injury.

## 2024-10-16 ENCOUNTER — Ambulatory Visit: Admitting: Family Medicine

## 2024-10-20 ENCOUNTER — Encounter: Payer: Self-pay | Admitting: General Practice
